# Patient Record
Sex: Female | Born: 1940 | Race: White | Hispanic: No | Smoking: Never smoker
Health system: Southern US, Community
[De-identification: ages and names within clinical notes are randomized; demographics above are authoritative.]

## PROBLEM LIST (undated history)

## (undated) DIAGNOSIS — E789 Disorder of lipoprotein metabolism, unspecified: Secondary | ICD-10-CM

## (undated) DIAGNOSIS — I639 Cerebral infarction, unspecified: Secondary | ICD-10-CM

## (undated) DIAGNOSIS — I10 Essential (primary) hypertension: Secondary | ICD-10-CM

## (undated) DIAGNOSIS — K219 Gastro-esophageal reflux disease without esophagitis: Secondary | ICD-10-CM

## (undated) HISTORY — PX: TRACHEOSTOMY CLOSURE: SHX458

## (undated) HISTORY — PX: ABDOMINAL AORTIC ANEURYSM REPAIR: SUR1152

## (undated) HISTORY — PX: TRACHEOSTOMY: SUR1362

## (undated) HISTORY — PX: CHOLECYSTECTOMY: SHX55

## (undated) HISTORY — PX: REPLACEMENT TOTAL KNEE: SUR1224

## (undated) HISTORY — PX: AORTIC VALVE REPAIR: SHX6306

---

## 2011-05-06 ENCOUNTER — Emergency Department (INDEPENDENT_AMBULATORY_CARE_PROVIDER_SITE_OTHER): Payer: Medicare Other

## 2011-05-06 ENCOUNTER — Encounter: Payer: Self-pay | Admitting: Emergency Medicine

## 2011-05-06 ENCOUNTER — Emergency Department (HOSPITAL_BASED_OUTPATIENT_CLINIC_OR_DEPARTMENT_OTHER)
Admission: EM | Admit: 2011-05-06 | Discharge: 2011-05-06 | Disposition: A | Discharge status: Home / Self Care | Payer: Medicare Other | Attending: Emergency Medicine | Admitting: Emergency Medicine

## 2011-05-06 DIAGNOSIS — M5412 Radiculopathy, cervical region: Secondary | ICD-10-CM | POA: Insufficient documentation

## 2011-05-06 DIAGNOSIS — G319 Degenerative disease of nervous system, unspecified: Secondary | ICD-10-CM

## 2011-05-06 DIAGNOSIS — I679 Cerebrovascular disease, unspecified: Secondary | ICD-10-CM

## 2011-05-06 DIAGNOSIS — R51 Headache: Secondary | ICD-10-CM

## 2011-05-06 DIAGNOSIS — E119 Type 2 diabetes mellitus without complications: Secondary | ICD-10-CM | POA: Insufficient documentation

## 2011-05-06 HISTORY — DX: Disorder of lipoprotein metabolism, unspecified: E78.9

## 2011-05-06 LAB — GLUCOSE, CAPILLARY: Glucose-Capillary: 133 mg/dL — ABNORMAL HIGH (ref 70–99)

## 2011-05-06 MED ORDER — METHYLPREDNISOLONE SODIUM SUCC 125 MG IJ SOLR: 125.0000 mg | Freq: Once | INTRAMUSCULAR | Status: AC

## 2011-05-06 MED ORDER — RANITIDINE HCL 50 MG/2ML IJ SOLN
50.0000 mg | Freq: Three times a day (TID) | INTRAMUSCULAR | Status: DC
  Filled 2011-05-06: qty 2

## 2011-05-06 MED ORDER — SODIUM CHLORIDE 0.9 % IV SOLN
Freq: Once | INTRAVENOUS | Status: AC
  Administered 2011-05-06: 11:00:00 via INTRAVENOUS

## 2011-05-06 MED ORDER — SODIUM CHLORIDE 0.9 % IV BOLUS (SEPSIS): 1000.0000 mL | Freq: Once | INTRAVENOUS | Status: DC

## 2011-05-06 MED ORDER — HYDROCODONE-ACETAMINOPHEN 5-500 MG PO TABS: 1.0000 | ORAL_TABLET | Freq: Four times a day (QID) | ORAL | Status: AC | PRN

## 2011-05-06 MED ORDER — DEXAMETHASONE SODIUM PHOSPHATE 10 MG/ML IJ SOLN
10.0000 mg | Freq: Once | INTRAMUSCULAR | Status: AC
  Administered 2011-05-06: 10 mg via INTRAVENOUS
  Filled 2011-05-06: qty 1

## 2011-05-06 MED ORDER — MORPHINE SULFATE 4 MG/ML IJ SOLN
4.0000 mg | Freq: Once | INTRAMUSCULAR | Status: AC
  Administered 2011-05-06: 4 mg via INTRAVENOUS
  Filled 2011-05-06: qty 1

## 2011-05-06 MED ORDER — PROMETHAZINE HCL 25 MG/ML IJ SOLN
12.5000 mg | Freq: Once | INTRAMUSCULAR | Status: AC
  Administered 2011-05-06: 12.5 mg via INTRAVENOUS
  Filled 2011-05-06: qty 1

## 2011-05-06 NOTE — ED Notes (Signed)
Patient is resting comfortably. 

## 2011-05-06 NOTE — ED Notes (Signed)
Family at bedside. 

## 2011-05-06 NOTE — ED Provider Notes (Addendum)
History     Chief Complaint  Patient presents with  . Headache   HPI Comments: Pt has had some chronic pain to left neck.  Has been worsening over last few weeks.  Now has intense pain to left neck, shooting pain to left head.  No dizziness  No vision changes.  No neuro deficits.  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 female presenting with headaches. The history is provided by the patient.  Headache  This is a new problem. The current episode started 2 days ago. The problem occurs constantly. The problem has been gradually worsening. The headache is associated with nothing. The pain is located in the left unilateral region. The quality of the pain is described as dull and throbbing. The pain is at a severity of 6/10. The pain is moderate. The pain radiates to the left neck. Associated symptoms include nausea. Pertinent negatives include no fever, no malaise/fatigue, no chest pressure, no near-syncope, no syncope and no vomiting. She has tried nothing for the symptoms.    Past Medical History  Diagnosis Date  . Diabetes mellitus   . Cholesterol serum elevated     Past Surgical History  Procedure Date  . Cholecystectomy     Family History  Problem Relation Age of Onset  . Diabetes Mother   . Heart failure Father     History  Substance Use Topics  . Smoking status: Never Smoker   . Smokeless tobacco: Not on file  . Alcohol Use: No    OB History    Grav Para Term Preterm Abortions TAB SAB Ect Mult Living                  Review of Systems  Constitutional: Negative for fever and malaise/fatigue.  HENT: Positive for neck pain. Negative for neck stiffness.   Cardiovascular: Negative for syncope and near-syncope.  Gastrointestinal: Positive for nausea. Negative for vomiting.  Neurological: Positive for headaches. Negative for syncope, speech difficulty, weakness, light-headedness and numbness.  All other systems reviewed and are negative.    Physical Exam  BP 145/71   Pulse 107  Temp(Src) 98.2 F (36.8 C) (Oral)  Resp 18  SpO2 98%  Physical Exam  Constitutional: She is oriented to person, place, and time. She appears well-developed and well-nourished.  HENT:  Head: Normocephalic and atraumatic.  Eyes: Pupils are equal, round, and reactive to light.  Neck:       Tenderness to left paraspinal area and left trapezius muscle  Cardiovascular: Normal rate and regular rhythm.   Murmur heard. Pulmonary/Chest: Effort normal and breath sounds normal.  Abdominal: Soft. Bowel sounds are normal. There is no tenderness.  Musculoskeletal: Normal range of motion.  Neurological: She is alert and oriented to person, place, and time. No cranial nerve deficit. Coordination normal.  Skin: Skin is warm and dry.    ED Course  Procedures  MDM Pain seems consistent with MS neck pain, likely radiculopathy.  No neuro deficits.  Head CT negative.  No mass, nothing to suggest ICH or meningitis Pt feeling much better after morphine.  Ready to go home     Rolan Bucco, MD 05/06/11 1221  Rolan Bucco, MD 05/06/11 1343  Rolan Bucco, MD 05/06/11 1346

## 2011-05-06 NOTE — ED Notes (Signed)
Patient denies pain and is resting comfortably.  

## 2011-05-06 NOTE — ED Notes (Signed)
Neck pain with headache x 24hrs

## 2011-05-06 NOTE — ED Notes (Signed)
The patient's CBG  Was 133.

## 2011-05-06 NOTE — ED Notes (Signed)
Pt report L sided headache and neck pain unrelieved by Vicodan po X 2 last PM denies any injury

## 2013-09-01 ENCOUNTER — Emergency Department (HOSPITAL_BASED_OUTPATIENT_CLINIC_OR_DEPARTMENT_OTHER): Payer: Medicare Other

## 2013-09-01 ENCOUNTER — Encounter (HOSPITAL_BASED_OUTPATIENT_CLINIC_OR_DEPARTMENT_OTHER): Payer: Self-pay | Admitting: Emergency Medicine

## 2013-09-01 ENCOUNTER — Emergency Department (HOSPITAL_BASED_OUTPATIENT_CLINIC_OR_DEPARTMENT_OTHER)
Admission: EM | Admit: 2013-09-01 | Discharge: 2013-09-02 | Disposition: A | Discharge status: Home / Self Care | Payer: Medicare Other | Attending: Emergency Medicine | Admitting: Emergency Medicine

## 2013-09-01 DIAGNOSIS — W1809XA Striking against other object with subsequent fall, initial encounter: Secondary | ICD-10-CM | POA: Insufficient documentation

## 2013-09-01 DIAGNOSIS — E7800 Pure hypercholesterolemia, unspecified: Secondary | ICD-10-CM | POA: Insufficient documentation

## 2013-09-01 DIAGNOSIS — IMO0002 Reserved for concepts with insufficient information to code with codable children: Secondary | ICD-10-CM | POA: Insufficient documentation

## 2013-09-01 DIAGNOSIS — Y9301 Activity, walking, marching and hiking: Secondary | ICD-10-CM | POA: Insufficient documentation

## 2013-09-01 DIAGNOSIS — R011 Cardiac murmur, unspecified: Secondary | ICD-10-CM | POA: Insufficient documentation

## 2013-09-01 DIAGNOSIS — E119 Type 2 diabetes mellitus without complications: Secondary | ICD-10-CM | POA: Insufficient documentation

## 2013-09-01 DIAGNOSIS — Z79899 Other long term (current) drug therapy: Secondary | ICD-10-CM | POA: Insufficient documentation

## 2013-09-01 DIAGNOSIS — S59909A Unspecified injury of unspecified elbow, initial encounter: Secondary | ICD-10-CM | POA: Insufficient documentation

## 2013-09-01 DIAGNOSIS — W19XXXA Unspecified fall, initial encounter: Secondary | ICD-10-CM

## 2013-09-01 DIAGNOSIS — S6990XA Unspecified injury of unspecified wrist, hand and finger(s), initial encounter: Secondary | ICD-10-CM | POA: Insufficient documentation

## 2013-09-01 DIAGNOSIS — Y92009 Unspecified place in unspecified non-institutional (private) residence as the place of occurrence of the external cause: Secondary | ICD-10-CM | POA: Insufficient documentation

## 2013-09-01 DIAGNOSIS — S0003XA Contusion of scalp, initial encounter: Secondary | ICD-10-CM | POA: Insufficient documentation

## 2013-09-01 DIAGNOSIS — S0093XA Contusion of unspecified part of head, initial encounter: Secondary | ICD-10-CM

## 2013-09-01 MED ORDER — ACETAMINOPHEN 325 MG PO TABS
650.0000 mg | ORAL_TABLET | Freq: Once | ORAL | Status: AC
  Administered 2013-09-01: 650 mg via ORAL

## 2013-09-01 MED ORDER — ACETAMINOPHEN 325 MG PO TABS
ORAL_TABLET | ORAL | Status: AC
  Filled 2013-09-01: qty 2

## 2013-09-01 NOTE — ED Notes (Signed)
Pt. Reports at end of triage her R elbow is hurting and her lower back hurts.

## 2013-09-01 NOTE — ED Notes (Signed)
Pt. Reports she fell backward in her garage hitting the back R side of her head.  No LOC per Pt.

## 2013-09-01 NOTE — ED Provider Notes (Signed)
CSN: [id number]     Arrival date & time 09/01/13  2134 History  This chart was scribed for Candyce Churn, MD by Leone Payor, ED Scribe. This patient was seen in room MH12/MH12 and the patient's care was started 10:05 PM.    Chief Complaint  Patient presents with  . Head Injury    Patient is a 72 y.o. female presenting with head injury. The history is provided by the patient. No language interpreter was used.  Head Injury Location:  Occipital Time since incident:  2 hours Mechanism of injury: fall   Chronicity:  New Relieved by:  Ice Worsened by:  Nothing tried Ineffective treatments:  None tried Associated symptoms: headache   Associated symptoms: no disorientation, no loss of consciousness, no nausea, no neck pain and no vomiting   Risk factors: aspirin (baby ASA daily) and being elderly     HPI Comments: Debbie Haynes is a 72 y.o. female who presents to the Emergency Department complaining of a head injury that occurred about 1-2 hours ago. Pt states she was walking up a single step when she lost her balance and fell backwards. She states the majority of the impact was to the back of her head. She denies having LOC before or after the fall. Pt states she was able to walk after the fall. She reports having a constant HA, mild back pain, and mild right elbow pain. She has applied ice with mild relief. She denies taking any blood thinners aside from a baby ASA. She denies any bleeding on her head, neck pain, nausea, vomiting.   Past Medical History  Diagnosis Date  . Diabetes mellitus   . Cholesterol serum elevated    Past Surgical History  Procedure Laterality Date  . Cholecystectomy     Family History  Problem Relation Age of Onset  . Diabetes Mother   . Heart failure Father    History  Substance Use Topics  . Smoking status: Never Smoker   . Smokeless tobacco: Not on file  . Alcohol Use: No   OB History   Grav Para Term Preterm Abortions TAB SAB Ect Mult Living                  Review of Systems  Gastrointestinal: Negative for nausea and vomiting.  Musculoskeletal: Negative for neck pain.  Neurological: Positive for headaches. Negative for loss of consciousness and syncope.  All other systems reviewed and are negative.    Allergies  Tetanus toxoids  Home Medications   Current Outpatient Rx  Name  Route  Sig  Dispense  Refill  . fenofibrate (TRICOR) 145 MG tablet   Oral   Take 145 mg by mouth daily.           . metFORMIN (GLUCOPHAGE) 500 MG tablet   Oral   Take 500 mg by mouth 2 (two) times daily with a meal.           . olmesartan (BENICAR) 20 MG tablet   Oral   Take 20 mg by mouth daily.           . pioglitazone (ACTOS) 15 MG tablet   Oral   Take 15 mg by mouth daily.           . pravastatin (PRAVACHOL) 80 MG tablet   Oral   Take 80 mg by mouth daily.           . sertraline (ZOLOFT) 100 MG tablet   Oral   Take  100 mg by mouth daily.            BP 158/77  Pulse 102  Temp(Src) 98.9 F (37.2 C) (Oral)  Resp 18  Ht 5\' 4"  (1.626 m)  Wt 215 lb (97.523 kg)  BMI 36.89 kg/m2  SpO2 96% Physical Exam  Nursing note and vitals reviewed. Constitutional: She is oriented to person, place, and time. She appears well-developed and well-nourished. No distress.  HENT:  Head: Normocephalic and atraumatic. Head is without raccoon's eyes and without Battle's sign.  Nose: Nose normal.  Contusion to the posterior scalp. No lacerations.   Eyes: Conjunctivae and EOM are normal. Pupils are equal, round, and reactive to light. No scleral icterus.  Neck: No spinous process tenderness and no muscular tenderness present.  Cardiovascular: Normal rate, regular rhythm and intact distal pulses.   Murmur heard.  Systolic murmur is present with a grade of 3/6  Pulmonary/Chest: Effort normal and breath sounds normal. She has no rales. She exhibits no tenderness.  Abdominal: Soft. There is no tenderness. There is no rebound and no  guarding.  Musculoskeletal: Normal range of motion. She exhibits no edema and no tenderness.       Thoracic back: She exhibits no tenderness and no bony tenderness.       Lumbar back: She exhibits no tenderness and no bony tenderness.  Small contusion over right elbow. Normal ROM of the right elbow.  No evidence of trauma to extremities, except as noted.  2+ distal pulses.    Neurological: She is alert and oriented to person, place, and time.  Skin: Skin is warm and dry. No rash noted.  Psychiatric: She has a normal mood and affect.    ED Course  Procedures   DIAGNOSTIC STUDIES: Oxygen Saturation is 96% on RA, adequate by my interpretation.    COORDINATION OF CARE: 10:18 PM Discussed treatment plan with pt at bedside and pt agreed to plan.   Labs Review Labs Reviewed - No data to display Imaging Review Ct Head Wo Contrast  09/01/2013   CLINICAL DATA:  Trauma. Patient fell, striking the back of the head. Headache, dizziness, neck pain. No loss of consciousness.  EXAM: CT HEAD WITHOUT CONTRAST  CT CERVICAL SPINE WITHOUT CONTRAST  TECHNIQUE: Multidetector CT imaging of the head and cervical spine was performed following the standard protocol without intravenous contrast. Multiplanar CT image reconstructions of the cervical spine were also generated.  COMPARISON:  CT head 05/06/2011.  MRI cervical spine 06/17/2011.  FINDINGS: CT HEAD FINDINGS  There is a large subcutaneous scalp hematoma over the right posterior parietal region. There is no underlying skull fracture. The ventricles and sulci appear symmetrical. Old lacune or infarct in the left basal ganglia. No mass effect or midline shift. No abnormal extra-axial fluid collections. Gray-white matter junctions are distinct. Basal cisterns are not effaced. No evidence of acute intracranial hemorrhage. No depressed skull fractures. Visualized paranasal sinuses and mastoid air cells are not opacified. Old nasal bone fractures. Old fracture  deformity of the left temporomandibular joint.  CT CERVICAL SPINE FINDINGS  There are degenerative changes throughout the cervical spine with narrowed cervical interspaces and associated endplate hypertrophic changes. Degenerative changes in the facet joints. Prominent degenerative changes at C1 to with significant loss of the space between the anterior arch of C1 and the odontoid process. There is associated osteophytic change and a subcortical cyst at the base of the odontoid. These changes were present on the prior MR examination. There is slight anterior  subluxation of C3 on C4 and C4 on C5. These changes are likely degenerative and are again stable since the previous study. There is no vertebral compression deformity. No prevertebral soft tissue swelling. Normal alignment of the facet joints. Vascular calcification in the cervical carotid and vertebral arteries.  IMPRESSION: CT head: Large subcutaneous hematoma over the right posterior parietal region. No acute intracranial abnormalities. Stable appearance of chronic changes since previous study.  CT cervical spine: Degenerative changes as described. No displaced fractures identified. Or   Electronically Signed   By: Burman Nieves M.D.   On: 09/01/2013 23:46   Ct Cervical Spine Wo Contrast  09/01/2013   CLINICAL DATA:  Trauma. Patient fell, striking the back of the head. Headache, dizziness, neck pain. No loss of consciousness.  EXAM: CT HEAD WITHOUT CONTRAST  CT CERVICAL SPINE WITHOUT CONTRAST  TECHNIQUE: Multidetector CT imaging of the head and cervical spine was performed following the standard protocol without intravenous contrast. Multiplanar CT image reconstructions of the cervical spine were also generated.  COMPARISON:  CT head 05/06/2011.  MRI cervical spine 06/17/2011.  FINDINGS: CT HEAD FINDINGS  There is a large subcutaneous scalp hematoma over the right posterior parietal region. There is no underlying skull fracture. The ventricles and  sulci appear symmetrical. Old lacune or infarct in the left basal ganglia. No mass effect or midline shift. No abnormal extra-axial fluid collections. Gray-white matter junctions are distinct. Basal cisterns are not effaced. No evidence of acute intracranial hemorrhage. No depressed skull fractures. Visualized paranasal sinuses and mastoid air cells are not opacified. Old nasal bone fractures. Old fracture deformity of the left temporomandibular joint.  CT CERVICAL SPINE FINDINGS  There are degenerative changes throughout the cervical spine with narrowed cervical interspaces and associated endplate hypertrophic changes. Degenerative changes in the facet joints. Prominent degenerative changes at C1 to with significant loss of the space between the anterior arch of C1 and the odontoid process. There is associated osteophytic change and a subcortical cyst at the base of the odontoid. These changes were present on the prior MR examination. There is slight anterior subluxation of C3 on C4 and C4 on C5. These changes are likely degenerative and are again stable since the previous study. There is no vertebral compression deformity. No prevertebral soft tissue swelling. Normal alignment of the facet joints. Vascular calcification in the cervical carotid and vertebral arteries.  IMPRESSION: CT head: Large subcutaneous hematoma over the right posterior parietal region. No acute intracranial abnormalities. Stable appearance of chronic changes since previous study.  CT cervical spine: Degenerative changes as described. No displaced fractures identified. Or   Electronically Signed   By: Burman Nieves M.D.   On: 09/01/2013 23:46  All radiology studies independently viewed by me.     EKG Interpretation   None       MDM   1. Fall, initial encounter   2. Head contusion, initial encounter    72 yo female with mechanical fall, striking the back of her head on concrete.  No syncope or LOC.  No nausea or vomiting.  Has  a hematoma to back of head, but no laceration.   Head and C spine CT negative for acute process.  She has good ROM of right elbow and she declined right elbow plain films. She ambulated prior to discharge.    I personally performed the services described in this documentation, which was scribed in my presence. The recorded information has been reviewed and is accurate.  Candyce Churn, MD 09/02/13 [phone]

## 2014-06-07 ENCOUNTER — Encounter (HOSPITAL_BASED_OUTPATIENT_CLINIC_OR_DEPARTMENT_OTHER): Payer: Self-pay | Admitting: Emergency Medicine

## 2014-06-07 ENCOUNTER — Emergency Department (HOSPITAL_BASED_OUTPATIENT_CLINIC_OR_DEPARTMENT_OTHER): Payer: Medicare Other

## 2014-06-07 ENCOUNTER — Emergency Department (HOSPITAL_BASED_OUTPATIENT_CLINIC_OR_DEPARTMENT_OTHER)
Admission: EM | Admit: 2014-06-07 | Discharge: 2014-06-07 | Disposition: A | Discharge status: Other Acute Inpatient Hospital | Payer: Medicare Other | Attending: Emergency Medicine | Admitting: Emergency Medicine

## 2014-06-07 DIAGNOSIS — Z79899 Other long term (current) drug therapy: Secondary | ICD-10-CM | POA: Diagnosis not present

## 2014-06-07 DIAGNOSIS — R079 Chest pain, unspecified: Secondary | ICD-10-CM | POA: Diagnosis present

## 2014-06-07 DIAGNOSIS — M542 Cervicalgia: Secondary | ICD-10-CM | POA: Insufficient documentation

## 2014-06-07 DIAGNOSIS — E789 Disorder of lipoprotein metabolism, unspecified: Secondary | ICD-10-CM | POA: Insufficient documentation

## 2014-06-07 DIAGNOSIS — E119 Type 2 diabetes mellitus without complications: Secondary | ICD-10-CM | POA: Diagnosis not present

## 2014-06-07 DIAGNOSIS — R0789 Other chest pain: Secondary | ICD-10-CM | POA: Diagnosis not present

## 2014-06-07 DIAGNOSIS — I10 Essential (primary) hypertension: Secondary | ICD-10-CM | POA: Insufficient documentation

## 2014-06-07 DIAGNOSIS — R61 Generalized hyperhidrosis: Secondary | ICD-10-CM | POA: Diagnosis not present

## 2014-06-07 HISTORY — DX: Essential (primary) hypertension: I10

## 2014-06-07 LAB — TROPONIN I

## 2014-06-07 LAB — BASIC METABOLIC PANEL
Anion gap: 18 — ABNORMAL HIGH (ref 5–15)
BUN: 24 mg/dL — AB (ref 6–23)
CHLORIDE: 99 meq/L (ref 96–112)
CO2: 25 mEq/L (ref 19–32)
Calcium: 10.2 mg/dL (ref 8.4–10.5)
Creatinine, Ser: 0.9 mg/dL (ref 0.50–1.10)
GFR calc Af Amer: 71 mL/min — ABNORMAL LOW (ref >90)
GFR calc non Af Amer: 61 mL/min — ABNORMAL LOW (ref >90)
GLUCOSE: 184 mg/dL — AB (ref 70–99)
POTASSIUM: 3.9 meq/L (ref 3.7–5.3)
Sodium: 142 mEq/L (ref 137–147)

## 2014-06-07 LAB — CBC
HEMATOCRIT: 40 % (ref 36.0–46.0)
HEMOGLOBIN: 13.1 g/dL (ref 12.0–15.0)
MCH: 29.7 pg (ref 26.0–34.0)
MCHC: 32.8 g/dL (ref 30.0–36.0)
MCV: 90.7 fL (ref 78.0–100.0)
Platelets: 232 10*3/uL (ref 150–400)
RBC: 4.41 MIL/uL (ref 3.87–5.11)
RDW: 14.9 % (ref 11.5–15.5)
WBC: 5.9 10*3/uL (ref 4.0–10.5)

## 2014-06-07 LAB — PRO B NATRIURETIC PEPTIDE: Pro B Natriuretic peptide (BNP): 719.7 pg/mL — ABNORMAL HIGH (ref 0–450)

## 2014-06-07 MED ORDER — MORPHINE SULFATE 4 MG/ML IJ SOLN
4.0000 mg | Freq: Once | INTRAMUSCULAR | Status: DC
  Filled 2014-06-07: qty 1

## 2014-06-07 MED ORDER — NITROGLYCERIN 2 % TD OINT
1.0000 [in_us] | TOPICAL_OINTMENT | Freq: Once | TRANSDERMAL | Status: AC
Start: 2014-06-07 — End: 2014-06-07
  Administered 2014-06-07: 1 [in_us] via TOPICAL
  Filled 2014-06-07: qty 1

## 2014-06-07 MED ORDER — NITROGLYCERIN 0.4 MG SL SUBL
0.4000 mg | SUBLINGUAL_TABLET | SUBLINGUAL | Status: DC | PRN
  Administered 2014-06-07 (×2): 0.4 mg via SUBLINGUAL
  Filled 2014-06-07: qty 1

## 2014-06-07 NOTE — ED Notes (Signed)
PT requested to hold morphine at this time.

## 2014-06-07 NOTE — ED Notes (Signed)
Patient the patient states that she is having some tightness in her chest for quiet a while. The patient reports that tonight she is hot and sweating all over, with a lot of indigestion. The patient also reports that she has multiple risk factors for heart attack

## 2014-06-07 NOTE — ED Provider Notes (Signed)
CSN: [id number]     Arrival date & time 06/07/14  1922 History  This chart was scribed for Elwin Mocha, MD, by Yevette Edwards, ED Scribe. This patient was seen in room MH06/MH06 and the patient's care was started at 7:38 PM.   First MD Initiated Contact with Patient 06/07/14 1929     Chief Complaint  Patient presents with  . Chest Pain    Patient is a 73 y.o. female presenting with chest pain. The history is provided by the patient.  Chest Pain Pain location:  Substernal area and L chest Pain quality: pressure   Pain radiates to:  L arm Pain radiates to the back: no   Pain severity:  Moderate Onset quality:  Sudden Timing:  Constant Progression:  Unchanged Chronicity:  New Context: at rest   Relieved by:  Nothing Associated symptoms: diaphoresis   Associated symptoms: no nausea and not vomiting    HPI Comments: Debbie Haynes is a 73 y.o. female, with a h/o HTN, who presents to the Emergency Department complaining of chest pain which began yesterday and worsened today. The pain was intermittent yesterday, but it has become constant for several hours. She rates the pain as 4/10, and she characterizes the pain as "soreness," and she reports the pain radiates to her left arm. She also endorses clamminess. She denies nausea or emesis. Debbie Haynes treated her symptoms with an aspirin without resolution.   She reports she has a murmur; she denies a h/o MI, CAD, or CHF. She states she had an echocardiogram done by Dr. Beverely Pace four months ago for surgical clearance.  She denies taking blood thinners. She also reports she has had several weeks of posterior, left-sided neck pain which radiates down to her shoulder. Debbie Haynes is a non-smoker.   Past Medical History  Diagnosis Date  . Diabetes mellitus   . Cholesterol serum elevated   . Hypertension    Past Surgical History  Procedure Laterality Date  . Cholecystectomy    . Replacement total knee     Family History  Problem Relation Age of  Onset  . Diabetes Mother   . Heart failure Father    History  Substance Use Topics  . Smoking status: Never Smoker   . Smokeless tobacco: Not on file  . Alcohol Use: No   No OB history provided.  Review of Systems  Constitutional: Positive for diaphoresis.  Cardiovascular: Positive for chest pain.  Gastrointestinal: Negative for nausea and vomiting.  Musculoskeletal: Positive for neck pain.  All other systems reviewed and are negative.   Allergies  Tetanus toxoids  Home Medications   Prior to Admission medications   Medication Sig Start Date End Date Taking? Authorizing Provider  fenofibrate (TRICOR) 145 MG tablet Take 145 mg by mouth daily.      Historical Provider, MD  metFORMIN (GLUCOPHAGE) 500 MG tablet Take 500 mg by mouth 2 (two) times daily with a meal.      Historical Provider, MD  olmesartan (BENICAR) 20 MG tablet Take 20 mg by mouth daily.      Historical Provider, MD  pioglitazone (ACTOS) 15 MG tablet Take 15 mg by mouth daily.      Historical Provider, MD  pravastatin (PRAVACHOL) 80 MG tablet Take 80 mg by mouth daily.      Historical Provider, MD  sertraline (ZOLOFT) 100 MG tablet Take 100 mg by mouth daily.      Historical Provider, MD   Triage Vitals: Pulse 97  Temp(Src) 98.6  F (37 C) (Oral)  Resp 20  SpO2 99%  Physical Exam  Nursing note and vitals reviewed. Constitutional: She appears well-developed and well-nourished. No distress.  HENT:  Head: Normocephalic and atraumatic.  Mouth/Throat: Oropharynx is clear and moist. No oropharyngeal exudate.  Eyes: Conjunctivae and EOM are normal. Pupils are equal, round, and reactive to light. Right eye exhibits no discharge. Left eye exhibits no discharge. No scleral icterus.  Neck: Normal range of motion. Neck supple. No JVD present. No thyromegaly present.  Cardiovascular: Normal rate, regular rhythm, normal heart sounds and intact distal pulses.  Exam reveals no gallop and no friction rub.   No murmur  heard. Pulmonary/Chest: Effort normal and breath sounds normal. No respiratory distress. She has no wheezes. She has no rales.  Abdominal: Soft. Bowel sounds are normal. She exhibits no distension and no mass. There is no tenderness.  Musculoskeletal: Normal range of motion. She exhibits no edema and no tenderness.  Lymphadenopathy:    She has no cervical adenopathy.  Neurological: She is alert. Coordination normal.  Skin: Skin is warm and dry. No rash noted. No erythema.  Psychiatric: She has a normal mood and affect. Her behavior is normal.    ED Course  Procedures (including critical care time)  DIAGNOSTIC STUDIES: Oxygen Saturation is 99% on room air, normal by my interpretation.    COORDINATION OF CARE:  7:49 PM- Discussed treatment plan with patient, and the patient agreed to the plan. The plan includes chest x-ray, EKG, lab work, nitro, and morphine. Discussed possibility of admission.   8:37 PM- Rechecked pt.  Labs Review Labs Reviewed  BASIC METABOLIC PANEL - Abnormal; Notable for the following:    Glucose, Bld 184 (*)    BUN 24 (*)    GFR calc non Af Amer 61 (*)    GFR calc Af Amer 71 (*)    Anion gap 18 (*)    All other components within normal limits  PRO B NATRIURETIC PEPTIDE - Abnormal; Notable for the following:    Pro B Natriuretic peptide (BNP) 719.7 (*)    All other components within normal limits  CBC  TROPONIN I    Imaging Review Dg Chest 2 View  06/07/2014   CLINICAL DATA:  Chest tightness  EXAM: CHEST  2 VIEW  COMPARISON:  None.  FINDINGS: Mild cardiomegaly. No overt edema. No focal opacities or effusions. No acute bony abnormality.  IMPRESSION: Cardiomegaly.  No active disease.   Electronically Signed   By: Charlett Nose M.D.   On: 06/07/2014 20:20     EKG Interpretation   Date/Time:  Sunday June 07 2014 19:30:53 EDT Ventricular Rate:  95 PR Interval:  160 QRS Duration: 88 QT Interval:  376 QTC Calculation: 472 R Axis:   -61 Text  Interpretation:  Normal sinus rhythm Left anterior fascicular block  Septal infarct , age undetermined No prior for comparison Confirmed by  [facility]  MD, Natha Guin ([id number]) on 06/07/2014 7:32:53 PM      MDM   Final diagnoses:  Chest pain, unspecified chest pain type    57F here with chest pain. Described as pressure with L arm radiation. No SOB, N/V, diarrhea. Patient here with stable vitals. Pain alleviated with NTG, paste put on. Troponin and EKG ok. Admitted to [facility] by Dr. Lowell Guitar for ACS r/o.  I personally performed the services described in this documentation, which was scribed in my presence. The recorded information has been reviewed and is accurate.     Carlena Sax  Gwendolyn Grant, MD 06/07/14 1610

## 2016-11-02 ENCOUNTER — Other Ambulatory Visit: Payer: Self-pay | Admitting: Sports Medicine

## 2016-11-02 DIAGNOSIS — S22000A Wedge compression fracture of unspecified thoracic vertebra, initial encounter for closed fracture: Secondary | ICD-10-CM

## 2016-11-09 ENCOUNTER — Encounter: Payer: Self-pay | Admitting: Radiology

## 2016-11-09 ENCOUNTER — Ambulatory Visit
Admission: RE | Admit: 2016-11-09 | Discharge: 2016-11-09 | Disposition: A | Discharge status: Home / Self Care | Payer: Medicare Other | Source: Ambulatory Visit | Attending: Sports Medicine | Admitting: Sports Medicine

## 2016-11-09 DIAGNOSIS — S22000A Wedge compression fracture of unspecified thoracic vertebra, initial encounter for closed fracture: Secondary | ICD-10-CM

## 2016-11-09 HISTORY — PX: IR GENERIC HISTORICAL: IMG1180011

## 2016-11-09 NOTE — Consult Note (Signed)
Chief Complaint: Patient was seen in consultation today for  Chief Complaint  Patient presents with  . Advice Only    Consult for Kyphoplasty    at the request of Begovich,John Emil  Referring Physician(s): Begovich,John Emil  History of Present Illness: Debbie Haynes is a 76 y.o. female with multiple medical problems including history of aortic dissection and surgical repair last year. The patient had a long recovery following the aortic repair. The patient was coughing a lot in November and had acute back pain associated with a coughing episode. The patient was found to have a new compression fracture at T12. The patient was treated conservatively with a back brace and pain medicines. Patient had a MRI on 10/23/2016 that demonstrated residual edema in the T12 vertebral body and indeterminate edema in the adjacent T11 vertebral body. The patient is essentially wheelchair bound but this is not related to her back pain. She using the wheelchair due to chronic left knee pain and she is awaiting a knee replacement sometime in the future. She's had a right knee replacement in the past. She had a hip fracture and repair in 2016. With regards to the back pain, the patient feels like she is getting better. She is only using pain medicine once or twice a day. She says that the pain is more on the left side but she also acknowledges that she has pain all over her body.  Past Medical History:  Diagnosis Date  . Cholesterol serum elevated   . Diabetes mellitus   . Hypertension     Past Surgical History:  Procedure Laterality Date  . CHOLECYSTECTOMY    . IR GENERIC HISTORICAL  11/09/2016   IR RADIOLOGIST EVAL & MGMT 11/09/2016 Richarda Overlie, MD [facility] INTERV RAD  . REPLACEMENT TOTAL KNEE      Allergies: Tetanus toxoids  Medications: Prior to Admission medications   Medication Sig Start Date End Date Taking? Authorizing Provider  fenofibrate (TRICOR) 145 MG tablet Take 145 mg by mouth daily.       Historical Provider, MD  metFORMIN (GLUCOPHAGE) 500 MG tablet Take 500 mg by mouth 2 (two) times daily with a meal.      Historical Provider, MD  olmesartan (BENICAR) 20 MG tablet Take 20 mg by mouth daily.      Historical Provider, MD  pioglitazone (ACTOS) 15 MG tablet Take 15 mg by mouth daily.      Historical Provider, MD  pravastatin (PRAVACHOL) 80 MG tablet Take 80 mg by mouth daily.      Historical Provider, MD  sertraline (ZOLOFT) 100 MG tablet Take 100 mg by mouth daily.      Historical Provider, MD     Family History  Problem Relation Age of Onset  . Diabetes Mother   . Heart failure Father     Social History   Social History  . Marital status: Widowed    Spouse name: N/A  . Number of children: N/A  . Years of education: N/A   Social History Main Topics  . Smoking status: Never Smoker  . Smokeless tobacco: Not on file  . Alcohol use No  . Drug use: No  . Sexual activity: Not Currently   Other Topics Concern  . Not on file   Social History Narrative  . No narrative on file      Review of Systems  Constitutional: Negative for activity change.  Respiratory: Negative.   Cardiovascular: Negative.   Musculoskeletal: Positive for back pain.  Left knee pain.    Vital Signs: BP 137/71 (BP Location: Left Arm, Patient Position: Sitting, Cuff Size: Normal)   Pulse 67   Temp 97.8 F (36.6 C) (Oral)   Ht 5\' 4"  (1.626 m)   Wt 175 lb (79.4 kg)   SpO2 96%   BMI 30.04 kg/m   Physical Exam  Constitutional: No distress.  Cardiovascular: Normal rate, regular rhythm and normal heart sounds.   Pulmonary/Chest: Effort normal and breath sounds normal.  Abdominal: Soft. Bowel sounds are normal.  Musculoskeletal:  The thoracic and lumbar spinous process areas were palpated. Patient complains of mild tenderness near the thoracolumbar junction. She has similar tenderness in the upper thoracic spine.  Skin: She is not diaphoretic.       Imaging: Ir Radiologist  Eval & Mgmt  Result Date: 11/09/2016 Please refer to "Notes" to see consult details.  MRI of the lumbar spine on 10/23/2016: Impression: Marrow edema in the posterior aspect of the T11 visual body into a lesser extent in the inferior endplate of T11 could be due to micro-fracturing and stress change but cannot be definitively characterize. Metastatic disease or myeloma are possible although thought to be less likely. T12 compression fraction as seen on prior plain films with the vertebral plana deformity identified. There is mild bony retropulsion off the superior and inferior endplates without central canal stenosis at T11-T12 or T12-L1. Mild central canal narrowing at L4-L5 where advanced facet degenerative disease results in 0.5 cm of anterolisthesis. Moderate to moderately severe bilateral foraminal narrowing at L5-S1 due to disc and facet arthropathy, worse on the right.  Labs:  CBC: No results for input(s): WBC, HGB, HCT, PLT in the last 8760 hours.  COAGS: No results for input(s): INR, APTT in the last 8760 hours.  BMP: No results for input(s): NA, K, CL, CO2, GLUCOSE, BUN, CALCIUM, CREATININE, GFRNONAA, GFRAA in the last 8760 hours.  Invalid input(s): CMP  LIVER FUNCTION TESTS: No results for input(s): BILITOT, AST, ALT, ALKPHOS, PROT, ALBUMIN in the last 8760 hours.  TUMOR MARKERS: No results for input(s): AFPTM, CEA, CA199, CHROMGRNA in the last 8760 hours.  Assessment and Plan:  76 year old with a T12 compression fracture. A T12 compression fracture appears to be at least 2 months old and patient appears to be improving with conservative therapy. I reviewed the radiographs and MRI with the patient and her daughter. I do think there has been some progressive height loss at T12 with some residual edema in this vertebral body. We discussed the possibility of performing vertebral augmentation with kyphoplasty or vertebroplasty. However, I'm not impressed by the patient's physical  examination and do not think that we could really improve her symptoms at this time. She appears to be progressing very well with conservative therapy. I reviewed the MRI and agree that there is some abnormal edema in the T11 vertebral body and this vertebral body is definitely a risk for a fracture but I do not see a fracture at this time. Therefore, I would not perform vertebroplasty or kyphoplasty at this level. Patient has no history of cancer and the edema pattern is not highly suggestive for malignancy.  Recommend continued conservative therapy with pain medicine as needed and wearing the brace as needed. If the patient were to develop new symptoms, we could get repeat imaging of the spine. At this time, the patient wants to pursue her left knee replacement because she feels like this is the limiting factor in her mobility and overall health.  Thank you for this interesting consult.  I greatly enjoyed meeting Debbie Haynes and look forward to participating in their care.  A copy of this report was sent to the requesting provider on this date.  Electronically Signed: Abundio MiuHENN, Sneijder Bernards RYAN 11/09/2016, 10:41 AM   I spent a total of  30 Minutes   in face to face in clinical consultation, greater than 50% of which was counseling/coordinating care for a vertebral body compression fracture.

## 2018-03-18 ENCOUNTER — Encounter (HOSPITAL_BASED_OUTPATIENT_CLINIC_OR_DEPARTMENT_OTHER): Payer: Self-pay | Admitting: Emergency Medicine

## 2018-03-18 ENCOUNTER — Other Ambulatory Visit: Payer: Self-pay

## 2018-03-18 ENCOUNTER — Emergency Department (HOSPITAL_BASED_OUTPATIENT_CLINIC_OR_DEPARTMENT_OTHER): Payer: Medicare Other

## 2018-03-18 ENCOUNTER — Emergency Department (HOSPITAL_BASED_OUTPATIENT_CLINIC_OR_DEPARTMENT_OTHER)
Admission: EM | Admit: 2018-03-18 | Discharge: 2018-03-18 | Disposition: A | Discharge status: Home / Self Care | Payer: Medicare Other | Attending: Emergency Medicine | Admitting: Emergency Medicine

## 2018-03-18 DIAGNOSIS — Z8673 Personal history of transient ischemic attack (TIA), and cerebral infarction without residual deficits: Secondary | ICD-10-CM | POA: Insufficient documentation

## 2018-03-18 DIAGNOSIS — Z96659 Presence of unspecified artificial knee joint: Secondary | ICD-10-CM | POA: Insufficient documentation

## 2018-03-18 DIAGNOSIS — R0989 Other specified symptoms and signs involving the circulatory and respiratory systems: Secondary | ICD-10-CM | POA: Diagnosis not present

## 2018-03-18 DIAGNOSIS — R05 Cough: Secondary | ICD-10-CM | POA: Insufficient documentation

## 2018-03-18 DIAGNOSIS — E119 Type 2 diabetes mellitus without complications: Secondary | ICD-10-CM | POA: Insufficient documentation

## 2018-03-18 DIAGNOSIS — R42 Dizziness and giddiness: Secondary | ICD-10-CM

## 2018-03-18 DIAGNOSIS — R0981 Nasal congestion: Secondary | ICD-10-CM | POA: Insufficient documentation

## 2018-03-18 DIAGNOSIS — I10 Essential (primary) hypertension: Secondary | ICD-10-CM | POA: Diagnosis not present

## 2018-03-18 DIAGNOSIS — N390 Urinary tract infection, site not specified: Secondary | ICD-10-CM | POA: Diagnosis not present

## 2018-03-18 HISTORY — DX: Cerebral infarction, unspecified: I63.9

## 2018-03-18 HISTORY — DX: Gastro-esophageal reflux disease without esophagitis: K21.9

## 2018-03-18 LAB — CBC WITH DIFFERENTIAL/PLATELET
Basophils Absolute: 0.1 10*3/uL (ref 0.0–0.1)
Basophils Relative: 1 %
EOS PCT: 3 %
Eosinophils Absolute: 0.2 10*3/uL (ref 0.0–0.7)
HCT: 39.9 % (ref 36.0–46.0)
Hemoglobin: 13 g/dL (ref 12.0–15.0)
Lymphocytes Relative: 17 %
Lymphs Abs: 1.2 10*3/uL (ref 0.7–4.0)
MCH: 28.7 pg (ref 26.0–34.0)
MCHC: 32.6 g/dL (ref 30.0–36.0)
MCV: 88.1 fL (ref 78.0–100.0)
Monocytes Absolute: 0.6 10*3/uL (ref 0.1–1.0)
Monocytes Relative: 9 %
NEUTROS ABS: 5.2 10*3/uL (ref 1.7–7.7)
Neutrophils Relative %: 70 %
Platelets: 154 10*3/uL (ref 150–400)
RBC: 4.53 MIL/uL (ref 3.87–5.11)
RDW: 14.8 % (ref 11.5–15.5)
WBC: 7.3 10*3/uL (ref 4.0–10.5)

## 2018-03-18 LAB — URINALYSIS, ROUTINE W REFLEX MICROSCOPIC
BILIRUBIN URINE: NEGATIVE
Glucose, UA: NEGATIVE mg/dL
HGB URINE DIPSTICK: NEGATIVE
Ketones, ur: NEGATIVE mg/dL
Nitrite: NEGATIVE
PH: 6 (ref 5.0–8.0)
Protein, ur: 100 mg/dL — AB
Specific Gravity, Urine: 1.01 (ref 1.005–1.030)

## 2018-03-18 LAB — COMPREHENSIVE METABOLIC PANEL
ALT: 15 U/L (ref 14–54)
AST: 21 U/L (ref 15–41)
Albumin: 4.2 g/dL (ref 3.5–5.0)
Alkaline Phosphatase: 58 U/L (ref 38–126)
Anion gap: 10 (ref 5–15)
BUN: 28 mg/dL — AB (ref 6–20)
CO2: 28 mmol/L (ref 22–32)
CREATININE: 0.93 mg/dL (ref 0.44–1.00)
Calcium: 9.6 mg/dL (ref 8.9–10.3)
Chloride: 105 mmol/L (ref 101–111)
GFR calc Af Amer: >60 mL/min (ref >60)
GFR, EST NON AFRICAN AMERICAN: 57 mL/min — AB (ref >60)
Glucose, Bld: 140 mg/dL — ABNORMAL HIGH (ref 65–99)
Potassium: 3.8 mmol/L (ref 3.5–5.1)
Sodium: 143 mmol/L (ref 135–145)
Total Bilirubin: 0.7 mg/dL (ref 0.3–1.2)
Total Protein: 7.5 g/dL (ref 6.5–8.1)

## 2018-03-18 LAB — URINALYSIS, MICROSCOPIC (REFLEX): WBC, UA: >50 WBC/hpf (ref 0–5)

## 2018-03-18 MED ORDER — CEPHALEXIN 500 MG PO CAPS: ORAL_CAPSULE | ORAL | 0 refills | Status: AC

## 2018-03-18 MED ORDER — MECLIZINE HCL 12.5 MG PO TABS: 12.5000 mg | ORAL_TABLET | Freq: Three times a day (TID) | ORAL | 0 refills | Status: AC | PRN

## 2018-03-18 MED ORDER — CEPHALEXIN 250 MG PO CAPS
500.0000 mg | ORAL_CAPSULE | Freq: Once | ORAL | Status: AC
  Administered 2018-03-18: 500 mg via ORAL
  Filled 2018-03-18: qty 2

## 2018-03-18 MED FILL — CEPHALEXIN 500 MG CAPSULE: 500 | 7 days supply | Qty: 28 | Fill #0

## 2018-03-18 MED FILL — MECLIZINE 12.5 MG CAPLET: 12.5 | 10 days supply | Qty: 30 | Fill #0

## 2018-03-18 NOTE — ED Notes (Signed)
Patient transported to CT 

## 2018-03-18 NOTE — ED Provider Notes (Signed)
Emergency Department Provider Note   I have reviewed the triage vital signs and the nursing notes.   HISTORY  Chief Complaint Dizziness   HPI Debbie Haynes is a 77 y.o. female with medical problems as documented below the presents the emergency department today secondary to dizziness.  Patient is with her daughter who lives with her and they state that she had liver congestion cough and runny nose last night she took a Tylenol Sinus.  She woke up this morning and had what she describes as vertigo worse than previously the past bad.  She had difficulty walking secondary to it.  She is hesitant to stand up even to go to the bathroom.  She has persistent headache left neck pain that radiates her back is worse with movement and is worse in the morning gets better throughout the day.  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 No other associated or modifying symptoms.    Past Medical History:  Diagnosis Date  . Cholesterol serum elevated   . CVA (cerebral vascular accident) (HCC)   . Diabetes mellitus   . GERD (gastroesophageal reflux disease)   . Hypertension     There are no active problems to display for this patient.   Past Surgical History:  Procedure Laterality Date  . ABDOMINAL AORTIC ANEURYSM REPAIR    . AORTIC VALVE REPAIR    . CHOLECYSTECTOMY    . IR GENERIC HISTORICAL  11/09/2016   IR RADIOLOGIST EVAL & MGMT 11/09/2016 Richarda OverlieAdam Henn, MD [facility] INTERV RAD  . REPLACEMENT TOTAL KNEE    . TRACHEOSTOMY    . TRACHEOSTOMY CLOSURE      Current Outpatient Rx  . Order #: [id number] Class: Historical Med  . Order #: [id number] Class: Historical Med  . Order #: [id number] Class: Historical Med  . Order #: [id number] Class: Print  . Order #: [id number] Class: Historical Med  . Order #: [id number] Class: Historical Med  . Order #: [id number] Class: Historical Med  . Order #: [id number] Class: Print  . Order #:  [id number] Class: Historical Med  . Order #: [id number] Class: Historical Med  . Order #: [id number] Class: Historical Med  . Order #: [id number] Class: Historical Med  . Order #: [id number] Class: Historical Med  . Order #: [id number] Class: Historical Med  . Order #: [id number] Class: Historical Med  . Order #: [id number] Class: Historical Med    Allergies Tetanus toxoids; Ace inhibitors; and Valdecoxib  Family History  Problem Relation Age of Onset  . Diabetes Mother   . Heart failure Father     Social History Social History   Tobacco Use  . Smoking status: Never Smoker  . Smokeless tobacco: Never Used  Substance Use Topics  . Alcohol use: No  . Drug use: No    Review of Systems  All other systems negative except as documented in the HPI. All pertinent positives and negatives as reviewed in the HPI. ____________________________________________   PHYSICAL EXAM:  VITAL SIGNS: ED Triage Vitals  Enc Vitals Group     BP 03/18/18 0801 138/86     Pulse Rate 03/18/18 0801 87     Resp 03/18/18 0801 18     Temp 03/18/18 0801 97.7 F (36.5 C)     Temp Source 03/18/18 0801 Oral     SpO2 03/18/18 0759 96 %     Weight 03/18/18 0801 181 lb (82.1 kg)     Height 03/18/18 0801 5\' 4"  (1.626 m)  Constitutional: Alert and oriented. Well appearing and in no acute distress. Eyes: Conjunctivae are normal. PERRL. EOMI. Head: Atraumatic. Nose: No congestion/rhinnorhea. Mouth/Throat: Mucous membranes are moist.  Oropharynx non-erythematous. Neck: No stridor.  No meningeal signs.   Cardiovascular: Normal rate, regular rhythm. Good peripheral circulation. Grossly normal heart sounds.   Respiratory: Normal respiratory effort.  No retractions. Lungs CTAB. Gastrointestinal: Soft and nontender. No distention.  usculoskeletal: No lower extremity tenderness nor edema. No gross deformities of extremities. Neurologic:  Normal speech and language. No gross focal neurologic deficits are appreciated.  Deferred ambulation initially, but ambulated slowly but at baseline prior to discharge with walker. Skin:  Skin is warm, dry and intact. No rash noted.   ____________________________________________   LABS (all labs ordered are listed, but only abnormal results are displayed)  Labs Reviewed  COMPREHENSIVE METABOLIC PANEL - Abnormal; Notable for the following components:      Result Value   Glucose, Bld 140 (*)    BUN 28 (*)    GFR calc non Af Amer 57 (*)    All other components within normal limits  URINALYSIS, ROUTINE W REFLEX MICROSCOPIC - Abnormal; Notable for the following components:   APPearance HAZY (*)    Protein, ur 100 (*)    Leukocytes, UA MODERATE (*)    All other components within normal limits  URINALYSIS, MICROSCOPIC (REFLEX) - Abnormal; Notable for the following components:   Bacteria, UA FEW (*)    Non Squamous Epithelial PRESENT (*)    All other components within normal limits  URINE CULTURE  CBC WITH DIFFERENTIAL/PLATELET   ____________________________________________  EKG   EKG Interpretation  Date/Time:  Monday March 18 2018 08:06:05 EDT Ventricular Rate:  85 PR Interval:    QRS Duration: 140 QT Interval:  425 QTC Calculation: 506 R Axis:   -88 Text Interpretation:  Sinus rhythm Right bundle branch block RBB new since last ecg in 2015 but seen on ECG 12/07/2017 at [facility] Reconfirmed by Natascha Edmonds, Barbara Cower [phone]) on 03/18/2018 9:09:28 AM       ____________________________________________  RADIOLOGY  Ct Head Wo Contrast  Result Date: 03/18/2018 CLINICAL DATA:  Acute headache, photosensitivity, no injury EXAM: CT HEAD WITHOUT CONTRAST TECHNIQUE: Contiguous axial images were obtained from the base of the skull through the vertex without intravenous contrast. COMPARISON:  CT brain scan of 09/14/2017 FINDINGS: Brain: There is little change in degree ventricular dilatation with prominent cortical sulci as well consistent with diffuse atrophy. Moderate small  vessel ischemic changes again noted throughout the periventricular white matter, and small lacunar infarcts are stable. No new hemorrhage, mass lesion, or acute infarction is seen. The fourth ventricle and basilar cisterns are unremarkable. Vascular: There is calcification of the vertebral arteries, but no vascular abnormality is seen on this unenhanced study. Skull: On bone window images, no acute calvarial abnormality is seen a small bony protrusion remains in the left frontal region and appears benign. Sinuses/Orbits: The paranasal sinuses are well pneumatized. Other: None. IMPRESSION: Stable atrophy and chronic small vessel ischemic change and small lacunar infarcts. No acute intracranial abnormality. Electronically Signed   By: Dwyane Dee M.D.   On: 03/18/2018 09:40    ____________________________________________    INITIAL IMPRESSION / ASSESSMENT AND PLAN / ED COURSE  Patient with what sounds like vertigo.  EKG with a bundle branch block that was seen on previous EKGs at [facility].  Also found to have urinary tract infection is possible contributing factor.  Will be discharged on meclizine and antibiotics.  Pertinent labs & imaging results that were available during my care of the patient were reviewed by me and considered in my medical decision making (see chart for details).  ____________________________________________  FINAL CLINICAL IMPRESSION(S) / ED DIAGNOSES  Final diagnoses:  Vertigo  Urinary tract infection without hematuria, site unspecified     MEDICATIONS GIVEN DURING THIS VISIT:  Medications  cephALEXin (KEFLEX) capsule 500 mg (500 mg Oral Given 03/18/18 1117)     NEW OUTPATIENT MEDICATIONS STARTED DURING THIS VISIT:  Discharge Medication List as of 03/18/2018  1:00 PM    START taking these medications   Details  cephALEXin (KEFLEX) 500 MG capsule 2 caps po bid x 7 days, Print    meclizine (ANTIVERT) 12.5 MG tablet Take 1 tablet (12.5 mg total) by mouth 3  (three) times daily as needed for dizziness., Starting Mon 03/18/2018, Print        Note:  This note was prepared with assistance of Dragon voice recognition software. Occasional wrong-word or sound-a-like substitutions may have occurred due to the inherent limitations of voice recognition software.   Adar Rase, Barbara Cower, MD 03/19/18 [phone]

## 2018-03-19 LAB — URINE CULTURE: Culture: NO GROWTH

## 2019-02-07 DEATH — deceased

## 2019-09-08 IMAGING — CT CT HEAD W/O CM
3 series · 14 of 47 positions shown, 16 images · non-contrast
Comparison: CT brain scan of 09/14/2017

CLINICAL DATA: Acute headache, photosensitivity, no injury

EXAM:
CT HEAD WITHOUT CONTRAST
TECHNIQUE: Contiguous axial images were obtained from the base of the skull
through the vertex without intravenous contrast.

[Series 2: head wo · axial · 0.45mm/px · z∈[-151,-21]mm · 8 of 32 slices shown, 10 images]
[im 3/32  brain]
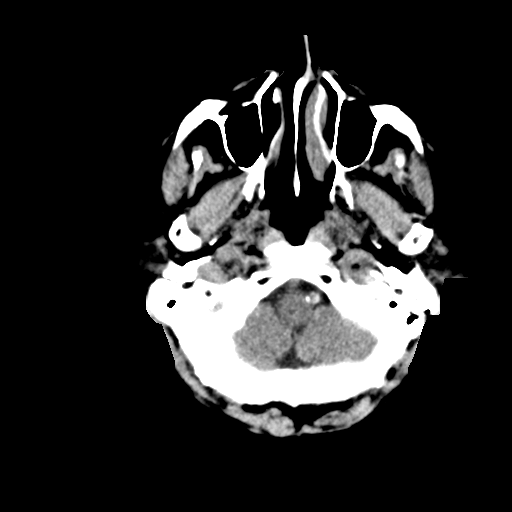
[im 3/32  bone]
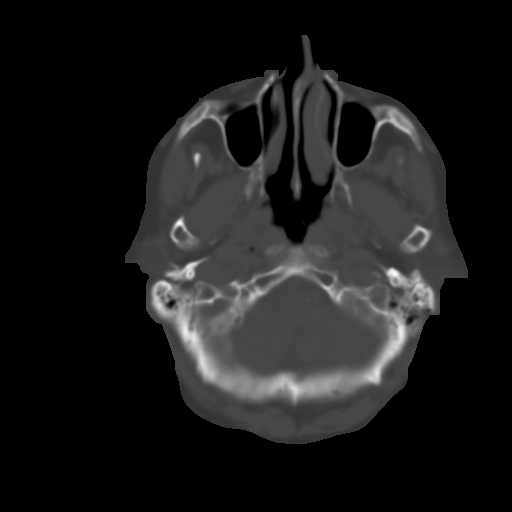
[im 7/32  brain]
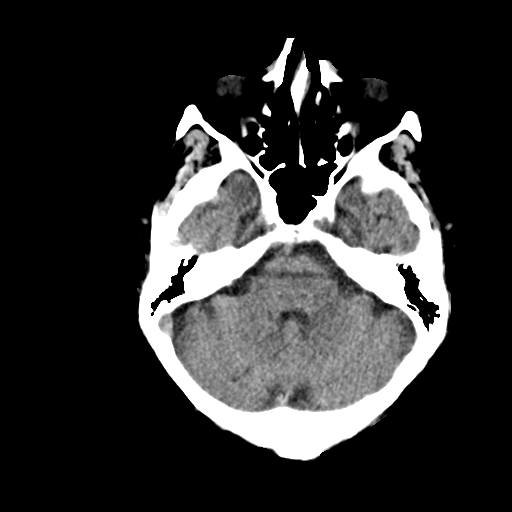
[im 10/32  brain]
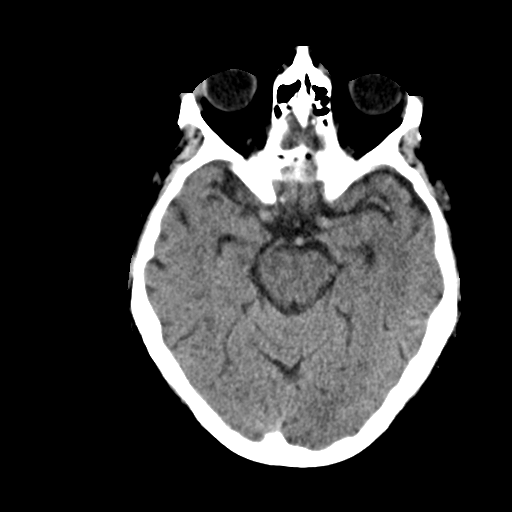
[im 14/32  brain]
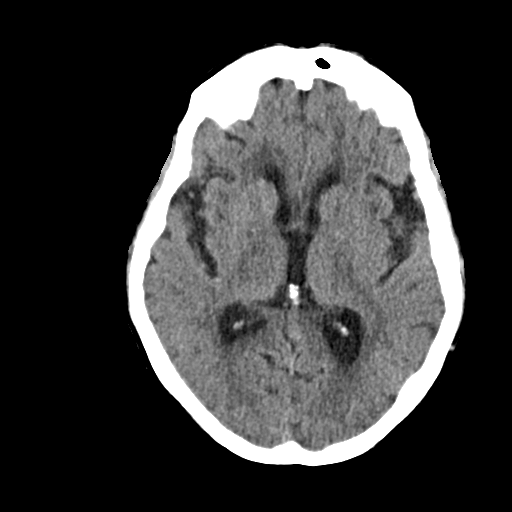
[im 18/32  brain]
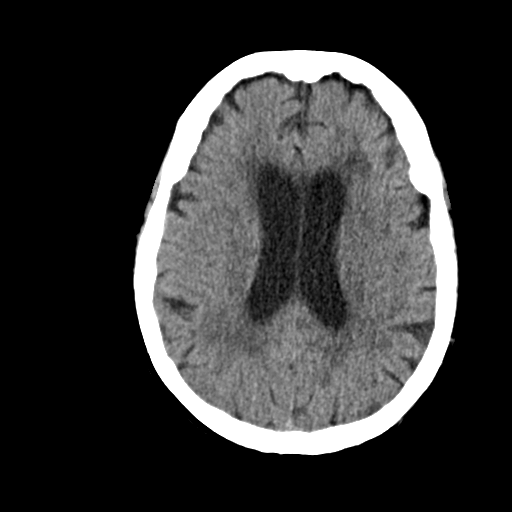
[im 18/32  bone]
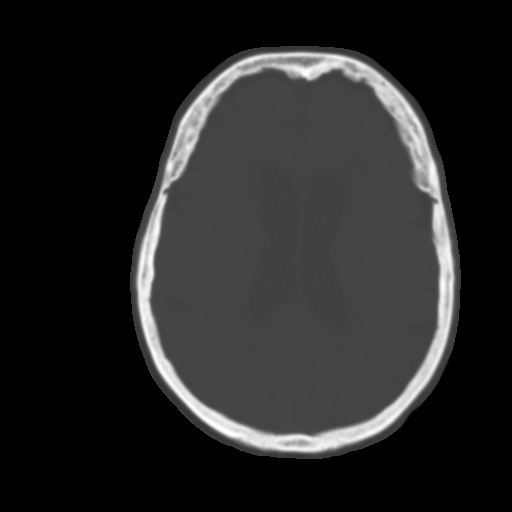
[im 22/32  brain]
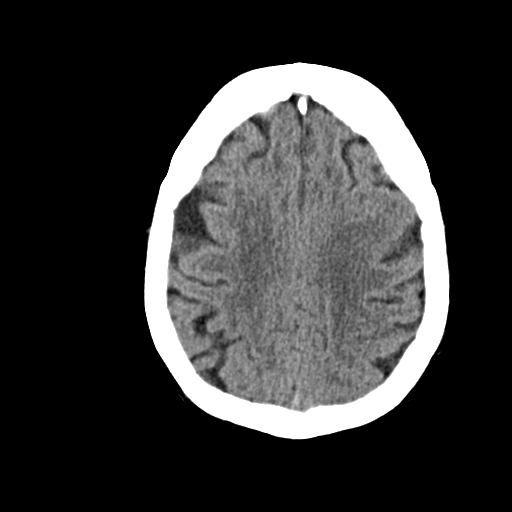
[im 25/32  brain]
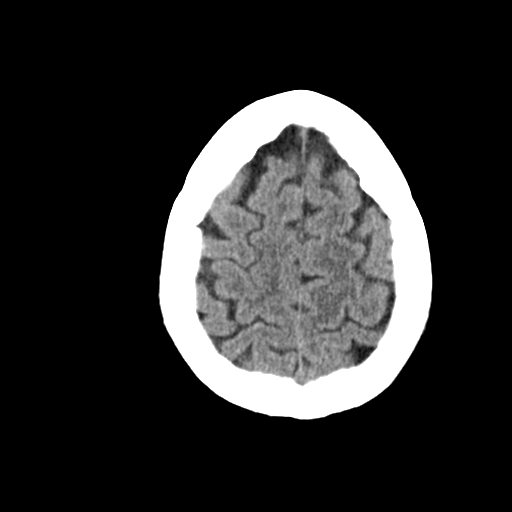
[im 29/32  brain]
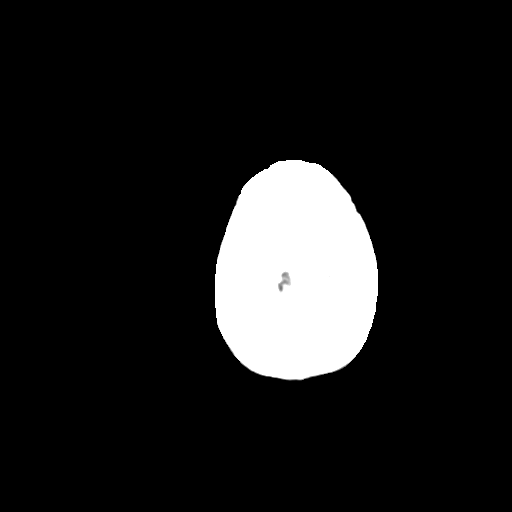

[Series 4: coronal soft · coronal · 0.33mm/px · 3 of 72 slices shown]
[im 24/72  brain]
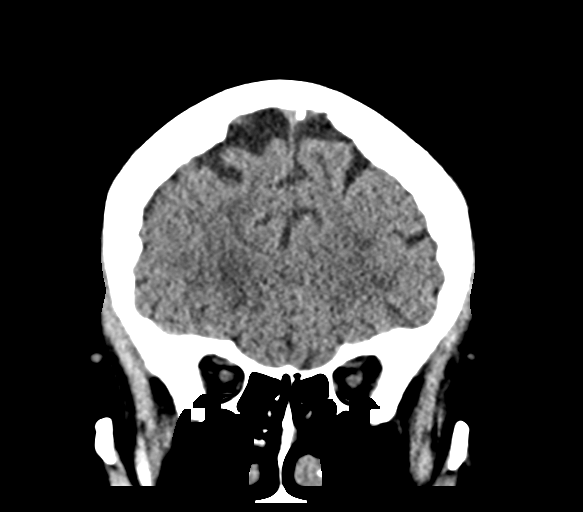
[im 32/72  brain]
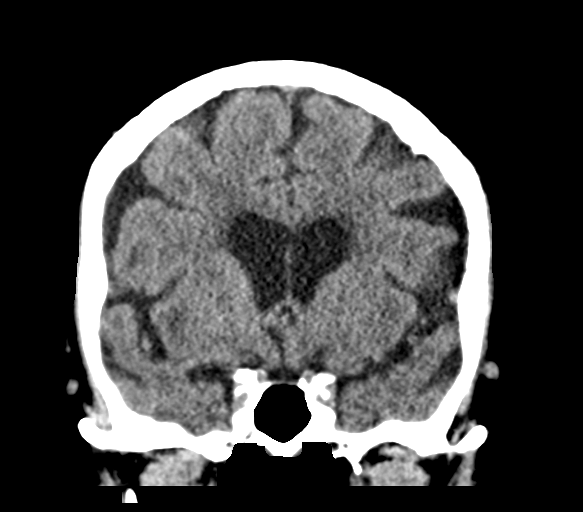
[im 40/72  brain]
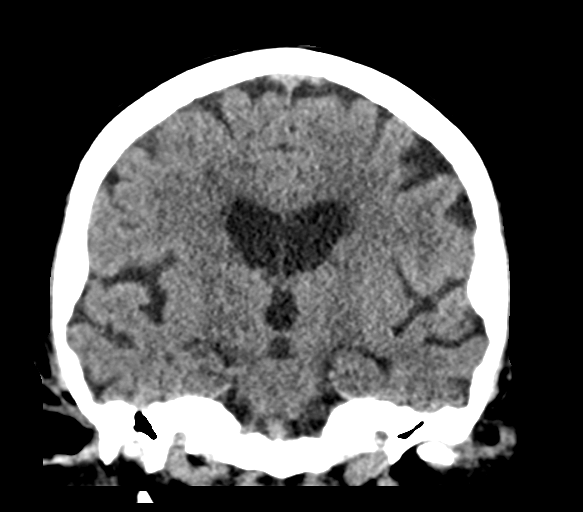

[Series 5: sag soft · sagittal · 0.30mm/px · 3 of 54 slices shown]
[im 18/54  brain]
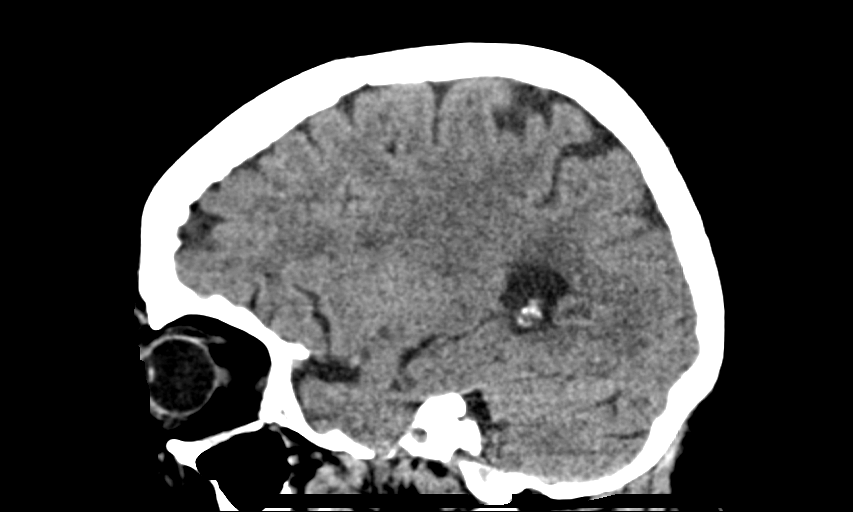
[im 27/54  brain]
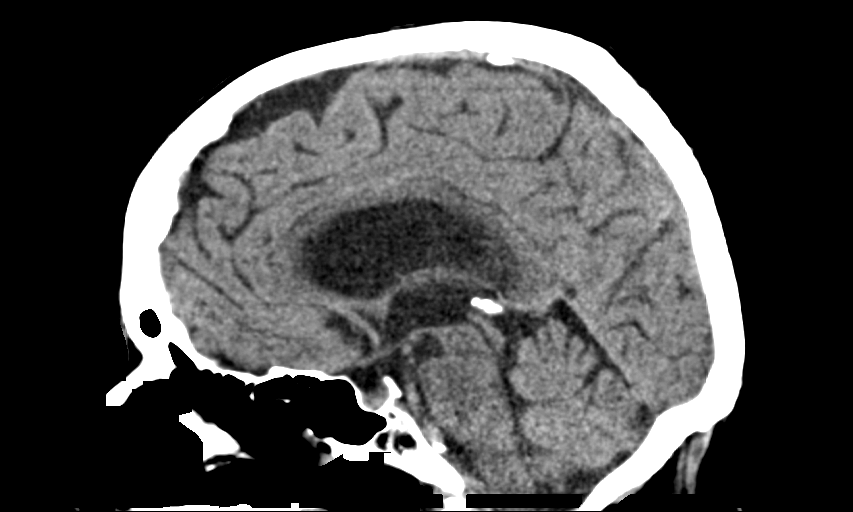
[im 36/54  brain]
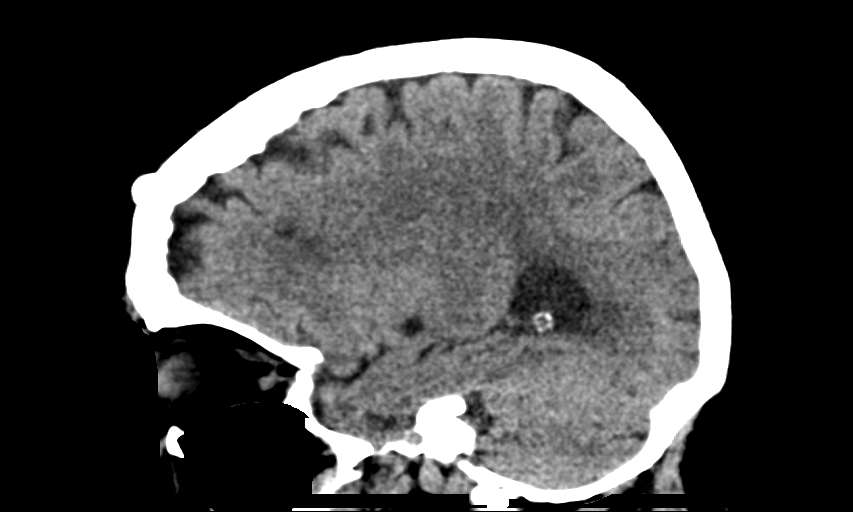

[14 of 47 positions shown; findings below may reference images not displayed]

FINDINGS: Brain: There is little change in degree ventricular dilatation with
prominent cortical sulci as well consistent with diffuse atrophy.
Moderate small vessel ischemic changes again noted throughout the
periventricular white matter, and small lacunar infarcts are stable.
No new hemorrhage, mass lesion, or acute infarction is seen. The
fourth ventricle and basilar cisterns are unremarkable.

Vascular: There is calcification of the vertebral arteries, but no
vascular abnormality is seen on this unenhanced study.

Skull: On bone window images, no acute calvarial abnormality is seen
a small bony protrusion remains in the left frontal region and
appears benign.

Sinuses/Orbits: The paranasal sinuses are well pneumatized.

Other: None.
IMPRESSION: Stable atrophy and chronic small vessel ischemic change and small
lacunar infarcts. No acute intracranial abnormality.
